# Patient Record
Sex: Male | Born: 1970 | Race: Black or African American | Hispanic: No | Marital: Single | Smoking: Former smoker
Health system: Southern US, Community
[De-identification: ages and names within clinical notes are randomized; demographics above are authoritative.]

## PROBLEM LIST (undated history)

## (undated) HISTORY — PX: EYE SURGERY: SHX253

---

## 2000-10-12 ENCOUNTER — Emergency Department (HOSPITAL_COMMUNITY): Admission: EM | Admit: 2000-10-12 | Discharge: 2000-10-12 | Payer: Self-pay

## 2002-07-30 ENCOUNTER — Emergency Department (HOSPITAL_COMMUNITY): Admission: EM | Admit: 2002-07-30 | Discharge: 2002-07-30 | Payer: Self-pay | Admitting: Emergency Medicine

## 2002-07-30 ENCOUNTER — Encounter: Payer: Self-pay | Admitting: Emergency Medicine

## 2011-07-07 ENCOUNTER — Other Ambulatory Visit (HOSPITAL_COMMUNITY): Payer: Self-pay | Admitting: Otolaryngology

## 2011-07-07 DIAGNOSIS — R221 Localized swelling, mass and lump, neck: Secondary | ICD-10-CM

## 2011-07-10 ENCOUNTER — Ambulatory Visit (HOSPITAL_COMMUNITY)
Admission: RE | Admit: 2011-07-10 | Discharge: 2011-07-10 | Disposition: A | Discharge status: Home / Self Care | Payer: Self-pay | Source: Ambulatory Visit | Attending: Otolaryngology | Admitting: Otolaryngology

## 2011-07-10 DIAGNOSIS — R221 Localized swelling, mass and lump, neck: Secondary | ICD-10-CM

## 2011-07-10 DIAGNOSIS — R220 Localized swelling, mass and lump, head: Secondary | ICD-10-CM | POA: Insufficient documentation

## 2011-07-14 ENCOUNTER — Other Ambulatory Visit: Payer: Self-pay | Admitting: Otolaryngology

## 2011-07-14 ENCOUNTER — Other Ambulatory Visit (HOSPITAL_COMMUNITY)
Admission: RE | Admit: 2011-07-14 | Discharge: 2011-07-14 | Disposition: A | Discharge status: Home / Self Care | Payer: Self-pay | Source: Ambulatory Visit | Attending: Otolaryngology | Admitting: Otolaryngology

## 2011-07-14 DIAGNOSIS — R599 Enlarged lymph nodes, unspecified: Secondary | ICD-10-CM | POA: Insufficient documentation

## 2011-07-28 ENCOUNTER — Other Ambulatory Visit (HOSPITAL_COMMUNITY): Payer: Self-pay

## 2011-08-02 ENCOUNTER — Other Ambulatory Visit: Payer: Self-pay | Admitting: Otolaryngology

## 2011-08-02 ENCOUNTER — Ambulatory Visit (HOSPITAL_COMMUNITY)
Admission: RE | Admit: 2011-08-02 | Discharge: 2011-08-02 | Disposition: A | Discharge status: Home / Self Care | Payer: Self-pay | Source: Ambulatory Visit | Attending: Otolaryngology | Admitting: Otolaryngology

## 2011-08-02 DIAGNOSIS — R599 Enlarged lymph nodes, unspecified: Secondary | ICD-10-CM | POA: Insufficient documentation

## 2011-08-02 DIAGNOSIS — L723 Sebaceous cyst: Secondary | ICD-10-CM | POA: Insufficient documentation

## 2011-08-02 LAB — CBC
HCT: 41.6 % (ref 39.0–52.0)
Hemoglobin: 14.4 g/dL (ref 13.0–17.0)
MCH: 29.4 pg (ref 26.0–34.0)
MCHC: 34.6 g/dL (ref 30.0–36.0)
MCV: 85.1 fL (ref 78.0–100.0)
Platelets: 177 10*3/uL (ref 150–400)
RBC: 4.89 MIL/uL (ref 4.22–5.81)
RDW: 14 % (ref 11.5–15.5)
WBC: 6.8 10*3/uL (ref 4.0–10.5)

## 2011-08-02 LAB — SURGICAL PCR SCREEN
MRSA, PCR: NEGATIVE
Staphylococcus aureus: NEGATIVE

## 2011-08-03 NOTE — Op Note (Signed)
NAME:  Dwayne Mendez, MANNING NO.:  [id number]  MEDICAL RECORD NO.:  [id number]  LOCATION:  [facility]                         FACILITY:  [facility]  PHYSICIAN:  Melvenia Beam, MD      DATE OF BIRTH:  01-Dec-1971  DATE OF PROCEDURE:  08/02/2011 DATE OF DISCHARGE:                              OPERATIVE REPORT   PREOPERATIVE DIAGNOSIS:  Level 1a neck mass/lymphadenopathy and right 1.5-cm facial cyst lateral to the right lateral canthus.  POSTOPERATIVE DIAGNOSIS:  Level 1a neck mass/lymphadenopathy and right 1.5-cm facial cyst lateral to the right lateral canthus.  PROCEDURES PERFORMED:  1. Open biopsy of the neck node and level 1a CPT 38510 2. excision with complex closure of right 1.5-cm facial lesion CPT 11442  SURGEON:  Melvenia Beam, MD PhD  ASSISTANT:  None.  FINDINGS:  A right facial cyst that was superficial to the orbicularis oculi, which grossly appeared to be a sebaceous cyst as well as a midline level Ia neck mass that appeared to be an enlarged lymph node along with several enlarged level 1a lymph nodes.  No injury to the temporal or zygomatic branch of facial nerve on the right and no injury to the marginal mandibular nerve bilaterally.  Intact facial nerve in all divisions bilaterally postoperatively.  COMPLICATIONS:  None.  ANESTHESIA:  General via endotracheal.  ESTIMATED BLOOD LOSS:  30 mL.  DRESSINGS:  Bacitracin.  DRAINS:  None.  SPECIMENS:  Level 1a neck dissection for frozen section and permanent pathology as well as culture and right 1.5 cm facial lesion for permanent pathology.  OPERATIVE DETAILS:  The patient was correctly identified in the preop holding area, brought back to the operating room from Anesthesia, placed supine on the operating table and intubated without incident.  The face was prepped and draped with sterile Betadine and sterile towels in a standard fashion.  The midline level 1a neck mass was palpated and a 3- cm incision in  horizontal fashion was drawn out over the mass.  This was injected with 1% lidocaine with epinephrine.  The right facial mass that was lateral to the right lateral canthus was also palpated.  A 3:1 length:width ratio ellipse was drawn out around this and measuring about 1.5 cm and this was also injected with 1% lidocaine with epinephrine.  First, we begin with the open biopsy of the deep cervical neck node and level Ia.  This was performed by making the incision with a #15 blade, carrying this through the subcutaneous and the subcutaneous fat layers and then switching to a Bovie electrocautery, dividing the platysma layer horizontally, and then dissecting down to the subplatysmal soft tissues.  This immediately demonstrated bulky level 1a lymphadenopathy with a dominant level 1a enlarged lymph node, measuring about 3 cm as well as several smaller surrounding level 1a lymph nodes.  These were carefully dissected out using the Bovie electrocautery, clearing out level 1a from the left mylohyoid muscle all the way over to the right mylohyoid muscle and dissecting these off of the strap muscles and clearing out the nodes and subplatysmal fat between the hyoglossus muscles bilaterally.  All of these nodes were removed en block and passed off and sent  for frozen section pathology, which came back as lymphadenopathy likely reactive, but pathology is going to defer to the permanent as well as culture, and flow cytometry to rule out lymphoma. While the frozen section pathology was coming back, we excised the right facial lesion.  Right facial lesion was excised by making an elliptical incision around the mass in the skin with a #15 blade, a combination of the tenotomies and #15 blade were then used to carefully dissect through the subcutaneous tissue and the subcutaneous fat coming around the base of the lesion.  Grossly, the lesion appeared to be a sebaceous cyst.  This was carefully dissected  out using the tenotomies until the lesion was excised entirely and taken off of the orbicularis oculi muscle.  We took care to stay superficial to the orbicularis oculi muscle to avoid any injury to the temporal or zygomatic branches of cranial nerve VII, which was noted to be intact and symmetric bilaterally postoperatively.  Once the mass was completely removed, the incision was closed in a standard fashion with deep 4-0 Vicryl sutures, which were buried.  Then, the skin was closed with interrupted 5-0 fast-absorbing gut sutures. The incision was then dressed with Bacitracin.  The midline submental incision was then closed in a similar fashion by reapproximating the platysma with horizontal mattress 3-0 Vicryl sutures and then closing the subcutaneous layer with 3-0 Vicryl deep buried sutures and then closing the skin with interrupted 5-0 fast-absorbing gut sutures.  This incision was then also dressed with bacitracin and the patient was returned under the care of anesthesia, awakened from anesthesia, and extubated without incident.  The patient tolerated the procedure well with no immediate complication.  Dr. Melvenia Beam was present and performed the entire procedure.          ______________________________ Melvenia Beam, MD     MG/MEDQ  D:  08/02/2011  T:  08/02/2011  Job:  [id number]  Electronically Signed by Melvenia Beam MD on 08/03/2011 12:02:55 PM

## 2011-08-05 LAB — TISSUE CULTURE: Culture: NO GROWTH

## 2011-09-14 LAB — AFB CULTURE WITH SMEAR (NOT AT ARMC)

## 2012-10-08 IMAGING — US US SOFT TISSUE HEAD/NECK
1 series · 14 of 15 positions shown · non-contrast
Comparison: None.

CLINICAL DATA: Submental mass.

ULTRASOUND OF HEAD/NECK SOFT TISSUES
TECHNIQUE: Ultrasound examination of the head and neck soft
tissues was performed in the area of clinical concern.

[Series 1: us soft tissue head/neck · 0.08mm/px · 14 of 15 slices shown]
[im 1/15]
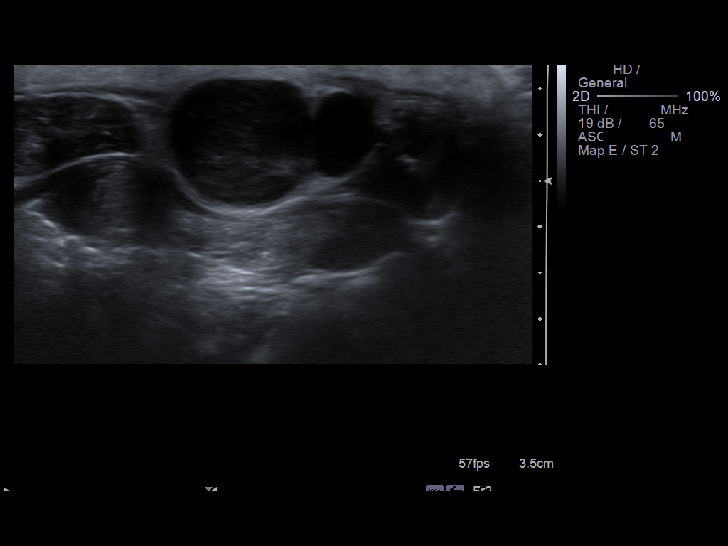
[im 2/15]
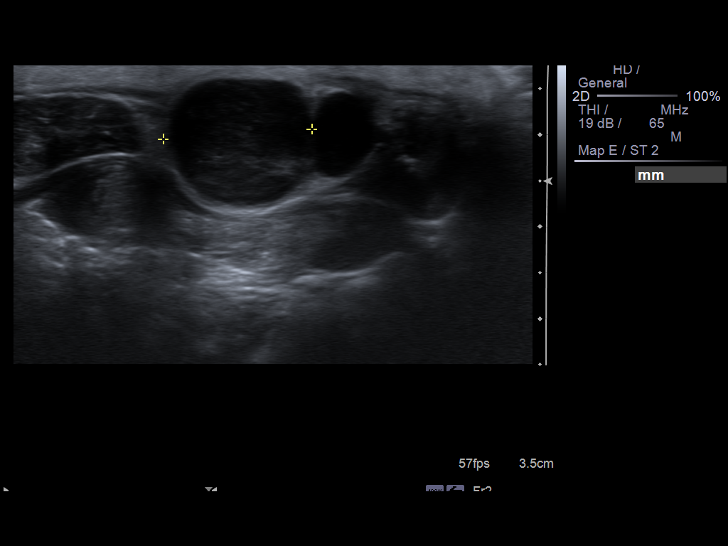
[im 3/15]
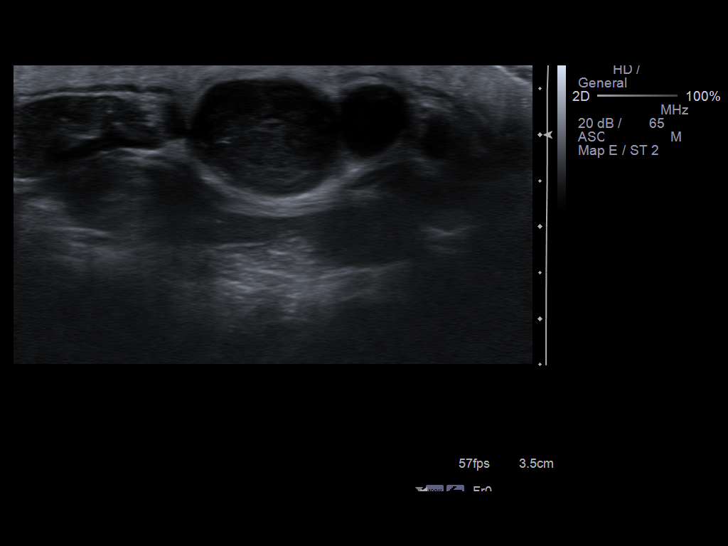
[im 4/15]
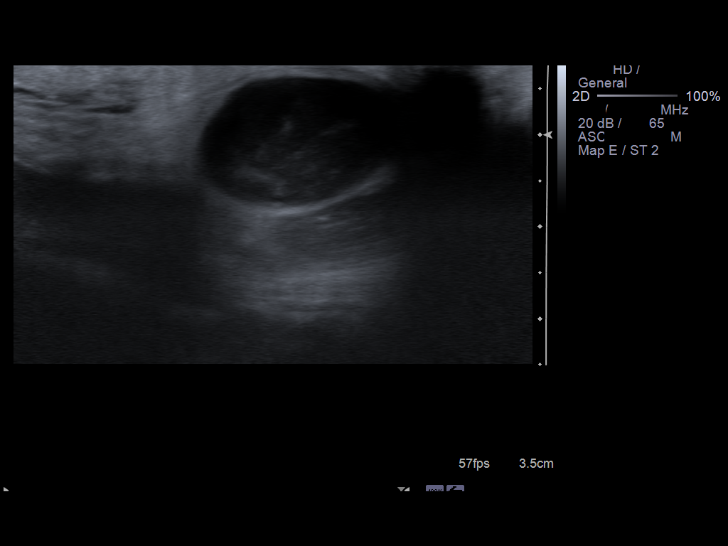
[im 5/15]
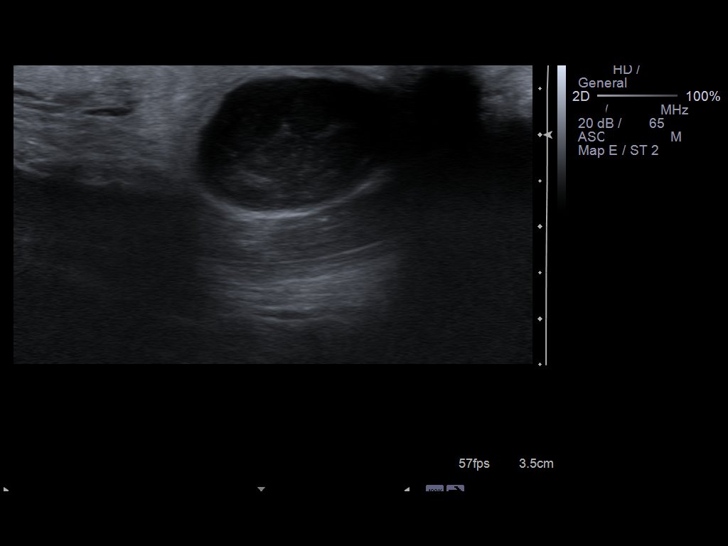
[im 6/15]
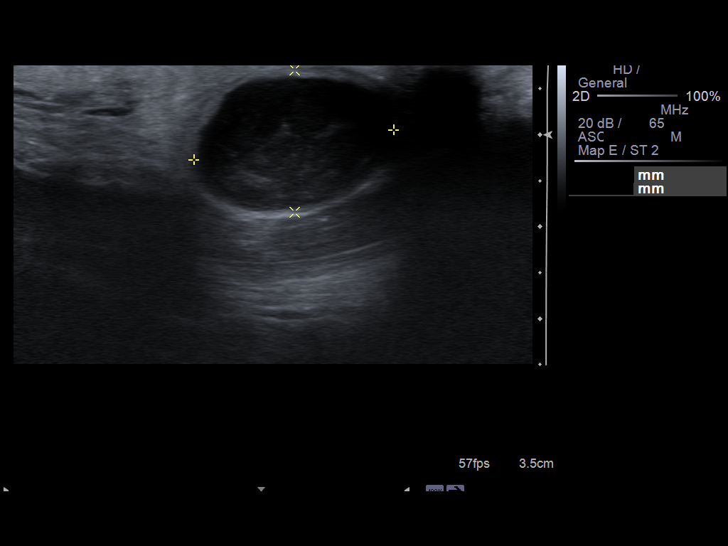
[im 7/15]
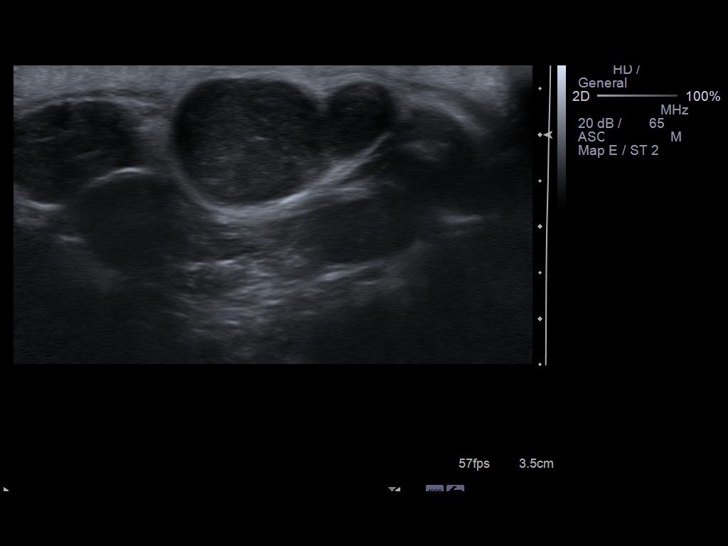
[im 9/15]
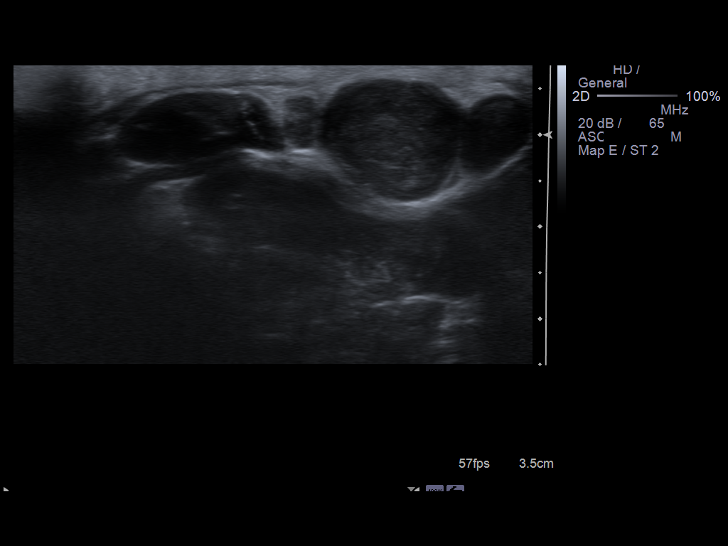
[im 10/15]
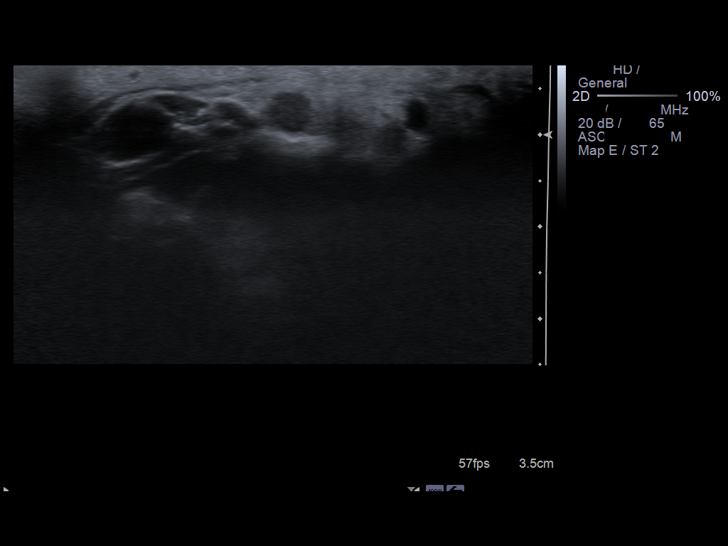
[im 11/15]
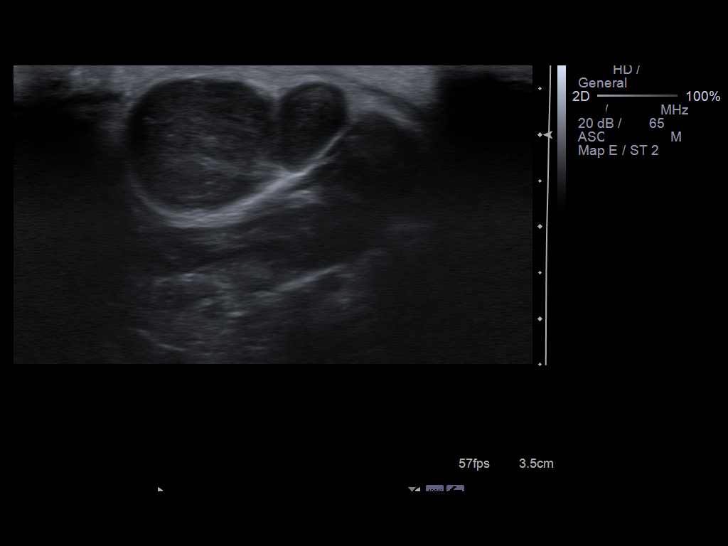
[im 12/15]
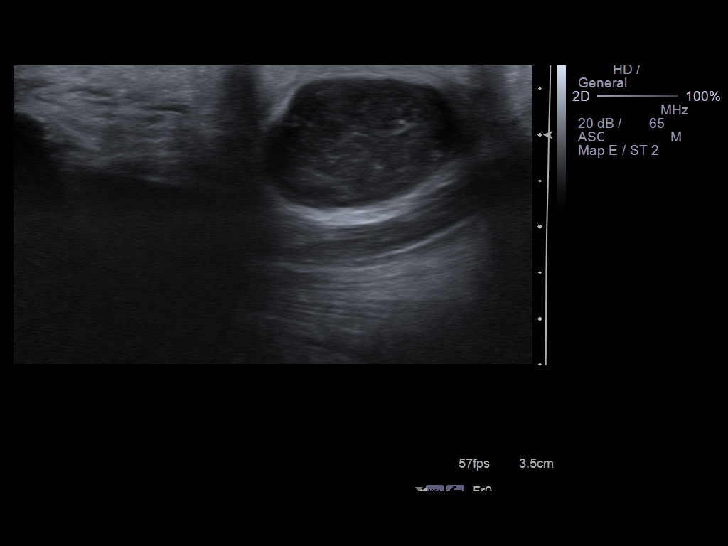
[im 13/15]
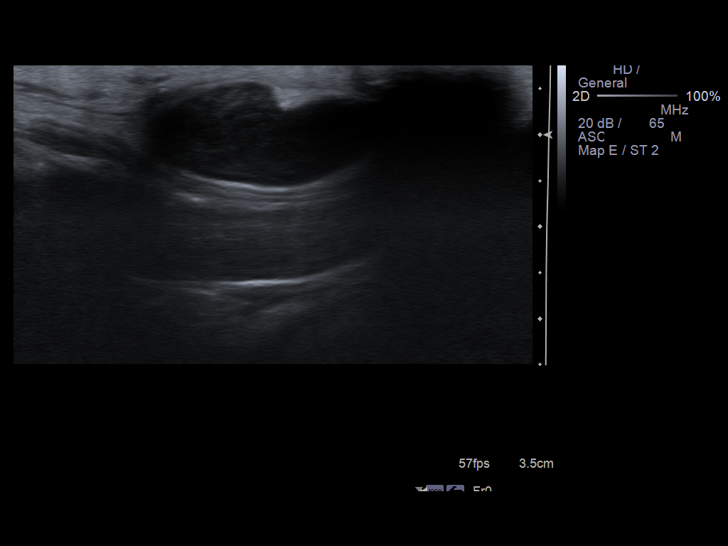
[im 14/15]
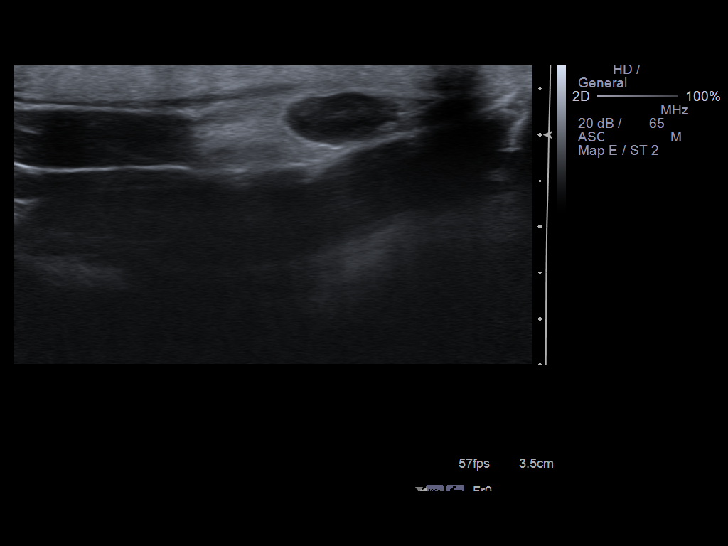
[im 15/15]
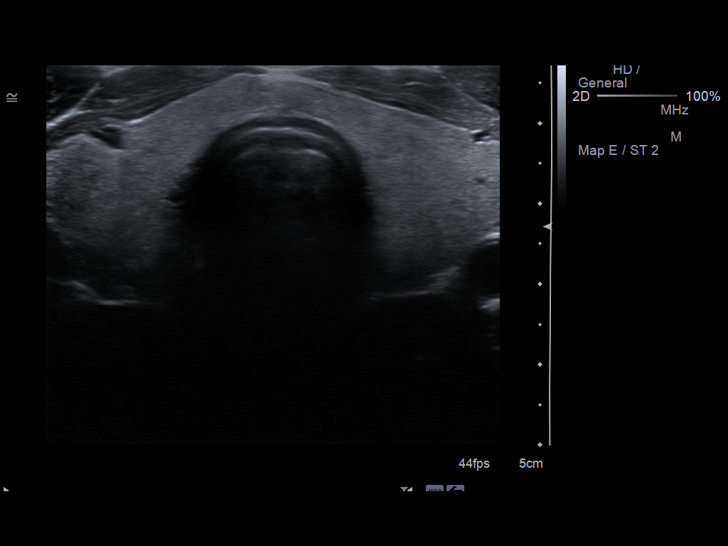

[14 of 15 positions shown; findings below may reference images not displayed]

FINDINGS: There is a 1.6 x 2.2 x 1.6 cm hypoechoic mass within the
midline submental space. There are at least three smaller
hypoechoic lesions adjacent to this dominant mass with similar
sonographic imaging features, and measure up to 0.8 x 1.6 cm.
These are favored to represent lymph nodes. The lesions are
separate from the thyroid gland.
IMPRESSION: 2.2 cm hypoechoic mass in the submental space is favored to
represent an abnormal lymph node.

There are several (at least three) adjacent prominent however
subcentimeter short axis lymph nodes with similar sonographic
features.

## 2017-05-16 ENCOUNTER — Encounter (HOSPITAL_COMMUNITY): Payer: Self-pay | Admitting: *Deleted

## 2017-05-16 ENCOUNTER — Emergency Department (HOSPITAL_COMMUNITY)
Admission: EM | Admit: 2017-05-16 | Discharge: 2017-05-16 | Disposition: A | Discharge status: Home / Self Care | Payer: Self-pay | Attending: Emergency Medicine | Admitting: Emergency Medicine

## 2017-05-16 ENCOUNTER — Emergency Department (HOSPITAL_COMMUNITY): Payer: Self-pay

## 2017-05-16 DIAGNOSIS — M5410 Radiculopathy, site unspecified: Secondary | ICD-10-CM | POA: Insufficient documentation

## 2017-05-16 MED ORDER — PREDNISONE 10 MG PO TABS: ORAL_TABLET | ORAL | 0 refills | Status: DC

## 2017-05-16 NOTE — Discharge Instructions (Signed)
Follow up with neurosurgery for further evaluation of pain. Take prednisone as prescribed to help with inflammation and pain. Return to ED if you have worsening symptoms including more pain, headaches, or increased back pain.

## 2017-05-16 NOTE — ED Notes (Signed)
PA at bedside.
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
## 2017-05-28 ENCOUNTER — Other Ambulatory Visit (HOSPITAL_COMMUNITY): Payer: Self-pay | Admitting: Family Medicine

## 2017-05-28 DIAGNOSIS — M542 Cervicalgia: Secondary | ICD-10-CM

## 2017-06-05 ENCOUNTER — Emergency Department (HOSPITAL_COMMUNITY): Admission: EM | Admit: 2017-06-05 | Discharge: 2017-06-05 | Discharge status: Absent without leave | Payer: Self-pay

## 2017-06-05 ENCOUNTER — Ambulatory Visit (HOSPITAL_COMMUNITY)
Admission: RE | Admit: 2017-06-05 | Discharge: 2017-06-05 | Disposition: A | Discharge status: Home / Self Care | Payer: Self-pay | Source: Ambulatory Visit | Attending: Family Medicine | Admitting: Family Medicine

## 2017-06-05 DIAGNOSIS — M47812 Spondylosis without myelopathy or radiculopathy, cervical region: Secondary | ICD-10-CM | POA: Insufficient documentation

## 2017-06-05 DIAGNOSIS — G8929 Other chronic pain: Secondary | ICD-10-CM | POA: Insufficient documentation

## 2017-06-05 DIAGNOSIS — M4802 Spinal stenosis, cervical region: Secondary | ICD-10-CM | POA: Insufficient documentation

## 2017-06-05 DIAGNOSIS — M542 Cervicalgia: Secondary | ICD-10-CM

## 2017-06-05 DIAGNOSIS — R220 Localized swelling, mass and lump, head: Secondary | ICD-10-CM | POA: Insufficient documentation

## 2017-06-05 DIAGNOSIS — M5134 Other intervertebral disc degeneration, thoracic region: Secondary | ICD-10-CM | POA: Insufficient documentation

## 2017-09-11 ENCOUNTER — Emergency Department (HOSPITAL_COMMUNITY)
Admission: EM | Admit: 2017-09-11 | Discharge: 2017-09-11 | Disposition: A | Discharge status: Home / Self Care | Payer: Self-pay | Attending: Emergency Medicine | Admitting: Emergency Medicine

## 2017-09-11 ENCOUNTER — Encounter (HOSPITAL_COMMUNITY): Payer: Self-pay | Admitting: Emergency Medicine

## 2017-09-11 DIAGNOSIS — Z87891 Personal history of nicotine dependence: Secondary | ICD-10-CM | POA: Insufficient documentation

## 2017-09-11 DIAGNOSIS — L02414 Cutaneous abscess of left upper limb: Secondary | ICD-10-CM | POA: Insufficient documentation

## 2017-09-11 MED ORDER — LIDOCAINE-EPINEPHRINE (PF) 2 %-1:200000 IJ SOLN
10.0000 mL | Freq: Once | INTRAMUSCULAR | Status: AC
  Administered 2017-09-11: 10 mL via INTRADERMAL
  Filled 2017-09-11: qty 20

## 2017-09-11 MED ORDER — DOXYCYCLINE HYCLATE 100 MG PO CAPS: 100.0000 mg | ORAL_CAPSULE | Freq: Two times a day (BID) | ORAL | 0 refills | Status: AC

## 2017-09-11 NOTE — ED Triage Notes (Signed)
Pt st's he was bitten by an insect 6 days ago on left forearm.  Now pt has an abscess to area

## 2017-09-11 NOTE — Discharge Instructions (Signed)
1. Medications: doxycycline 2. Treatment: keep wound clean and dry. Apply warm compresses to arm throughout the day. Take antibiotic to completion. Alternate 600 mg of ibuprofen and [phone] mg of Tylenol every 3 hours as needed for pain. Do not exceed 4000 mg of Tylenol daily. 3. Follow Up: Followup with [facility], urgent care, or PCP in 2 days for wound recheck.  Return to emergency department for emergent changing or worsening symptoms.

## 2017-09-11 NOTE — ED Triage Notes (Signed)
PT vagled during the I/D of abscess on Lt anterior forearm. Pt vomited x1 . Vitals signs WNL and Pt is A/O

## 2017-09-11 NOTE — ED Provider Notes (Signed)
[facility] Provider Note   CSN: [id number] Arrival date & time: 09/11/17  1629     History   Chief Complaint Chief Complaint  Patient presents with  . Insect Bite    HPI Dwayne Mendez is a 46 y.o. male who presents today with chief complaint acute onset, progressively worsening nodule to the volar aspect of the left forearm. He states that 6 days ago he believes he experienced an insect bite which resulted in erythema and a bump to the skin. He states that since then, the area has become progressively more swollen and erythematous. It is mildly tender to palpation. He also endorses swelling of the forearm, denies numbness, tingling, or weakness. He did apply a warm compress and was able to express clear fluid from the area earlier today. Denies fevers, chills, or other symptoms. 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  The history is provided by the patient.    History reviewed. No pertinent past medical history.  There are no active problems to display for this patient.   Past Surgical History:  Procedure Laterality Date  . EYE SURGERY         Home Medications    Prior to Admission medications   Medication Sig Start Date End Date Taking? Authorizing Provider  doxycycline (VIBRAMYCIN) 100 MG capsule Take 1 capsule (100 mg total) by mouth 2 (two) times daily. 09/11/17 09/21/17  Michela Pitcher A, PA-C  predniSONE (DELTASONE) 10 MG tablet Days 1-4 take 6 tablets a day Days 5-6 take 5 tablets a day Days 7-8 take 4 tablets a day Days 9-10 take 3 tablets a day Days 11-12 take 2 tablets a day 05/16/17   Caccavale, Sophia, PA-C    Family History No family history on file.  Social History Social History  Substance Use Topics  . Smoking status: Former Games developer  . Smokeless tobacco: Never Used  . Alcohol use No     Allergies   Penicillins   Review of Systems Review of Systems    Constitutional: Negative for chills and fever.  Skin: Positive for color change.  Neurological: Negative for weakness and numbness.     Physical Exam Updated Vital Signs BP (!) 100/52 (BP Location: Right Arm)   Pulse (!) 53   Temp 98 F (36.7 C) (Oral)   Resp 16   Ht  (1.778 m)   Wt 86.2 kg (190 lb)   SpO2 100%   BMI 27.26 kg/m   Physical Exam  Constitutional: He appears well-developed and well-nourished. No distress.  HENT:  Head: Normocephalic and atraumatic.  Eyes: Conjunctivae are normal. Right eye exhibits no discharge. Left eye exhibits no discharge.  Neck: Normal range of motion. Neck supple. No JVD present. No tracheal deviation present.  Cardiovascular: Normal rate and intact distal pulses.   2+ radial pulses bilaterally  Pulmonary/Chest: Effort normal.  Abdominal: He exhibits no distension.  Musculoskeletal: Normal range of motion. He exhibits no edema.  Neurological: He is alert.  Fluent speech, no facial droop, sensation intact to soft touch of extremities  Skin: Skin is warm and dry. There is erythema.  2cm x 2cm indurated and fluctuant area to the volar aspect of the left forearm. There is surrounding erythema of approximately 2 cm circumferentially. No bleeding. Mildly tender to palpation.  Psychiatric: He has a normal mood and affect. His behavior is normal.  Nursing note and vitals reviewed.  ED Treatments / Results  Labs (all labs ordered are listed, but only abnormal results are displayed) Labs Reviewed - No data to display  EKG  EKG Interpretation None       Radiology No results found.  Procedures .Marland KitchenIncision and Drainage Date/Time: 09/11/2017 7:03 PM Performed by: Michela Pitcher A Authorized by: Michela Pitcher A   Consent:    Consent obtained:  Verbal   Consent given by:  Patient   Risks discussed:  Bleeding, incomplete drainage and pain   Alternatives discussed:  No treatment Location:    Type:  Abscess   Size:  2cm    Location:  Upper extremity   Upper extremity location:  Arm   Arm location:  L lower arm Pre-procedure details:    Skin preparation:  Betadine Anesthesia (see MAR for exact dosages):    Anesthesia method:  Local infiltration   Local anesthetic:  Lidocaine 2% WITH epi Procedure type:    Complexity:  Simple Procedure details:    Needle aspiration: no     Incision types:  Stab incision   Incision depth:  Subcutaneous   Scalpel blade:  11   Wound management:  Probed and deloculated, irrigated with saline and extensive cleaning   Drainage:  Purulent   Drainage amount:  Moderate   Wound treatment:  Wound left open   Packing materials:  None Post-procedure details:    Patient tolerance of procedure:  Tolerated well, no immediate complications Comments:     Patient experienced a vasovagal episode directly after procedure for approximately 3 seconds. Vitals obtained, patient reassessed and he is back to baseline. No focal neurological deficits on examination. And pt is A &O 4.    (including critical care time)  Medications Ordered in ED Medications  lidocaine-EPINEPHrine (XYLOCAINE W/EPI) 2 %-1:200000 (PF) injection 10 mL (10 mLs Intradermal Given 09/11/17 1904)     Initial Impression / Assessment and Plan / ED Course  I have reviewed the triage vital signs and the nursing notes.  Pertinent labs & imaging results that were available during my care of the patient were reviewed by me and considered in my medical decision making (see chart for details).     Patient with skin abscess amenable to incision and drainage. Afebrile, VVS. Neurovascularly intact. Abscess was not large enough to warrant packing or drain,  wound recheck in 2 days. Encouraged home warm soaks and flushing. Moderate signs of cellulitis is surrounding skin. Given he [place] in [place] heavily [place] area and spends a lot of time outdoors, will d/c to home with course of doxycycline. Patient did have a brief vasovagal episode  after procedure, but returned to baseline shortly thereafter. He is resting comfortably at this time. Discussed indications for return to the ED. Pt verbalized understanding of and agreement with plan and is safe for discharge home at this time.  Final Clinical Impressions(s) / ED Diagnoses   Final diagnoses:  Abscess of left forearm    New Prescriptions Discharge Medication List as of 09/11/2017  7:07 PM    START taking these medications   Details  doxycycline (VIBRAMYCIN) 100 MG capsule Take 1 capsule (100 mg total) by mouth 2 (two) times daily., Starting Tue 09/11/2017, Until Fri 09/21/2017, Print         Ramona Slinger, [place] A, PA-C 09/12/17 0136    Mancel Bale, MD 09/12/17 1325

## 2017-09-11 NOTE — ED Notes (Signed)
Pt stable, ambulatory, states understanding of discharge instructions 

## 2020-03-22 NOTE — H&P (Signed)
HPI:   Chief Complaint  Patient presents with  . Adenopathy  with cyst since a year right side below jaw.   Dwayne Mendez is a 49 y.o. male who presents as new patient for right sided neck mass. Duration of one year. It has not fluctuated in size. It does not bother him and has never been infected. It does not fluctuate with meals. Location is under the right jaw. It does not drain. He did have a small cyst on the left inner thigh that he had popped and it resolved.   No fever, dysphagia, odynophagia, cough, hoarseness or dental problems.   No PMH of cardiopulmonary disease, diabetes, obstructive sleep apnea or bleeding disorder.   Non-smoker. He is a Producer, television/film/video.   PMH/Meds/All/SocHx/FamHx/ROS:   History reviewed. No pertinent past medical history.  History reviewed. No pertinent surgical history.  No family history of bleeding disorders, wound healing problems or difficulty with anesthesia.   Social History   Socioeconomic History  . Marital status: Separated Not Legal  Spouse name: Not on file  . Number of children: Not on file  . Years of education: Not on file  . Highest education level: Not on file  Occupational History  . Not on file  Tobacco Use  . Smoking status: Never Smoker  . Smokeless tobacco: Never Used  Substance and Sexual Activity  . Alcohol use: Not on file  . Drug use: Not on file  . Sexual activity: Not on file  Other Topics Concern  . Not on file  Social History Narrative  . Not on file   Social Determinants of Health   Financial Resource Strain:  . Difficulty of Paying Living Expenses:  Food Insecurity:  . Worried About Programme researcher, broadcasting/film/video in the Last Year:  . Barista in the Last Year:  Transportation Needs:  . Freight forwarder (Medical):  Marland Kitchen Lack of Transportation (Non-Medical):  Physical Activity:  . Days of Exercise per Week:  . Minutes of Exercise per Session:  Stress:  . Feeling of Stress :  Social Connections:  .  Frequency of Communication with Friends and Family:  . Frequency of Social Gatherings with Friends and Family:  . Attends Religious Services:  . Active Member of Clubs or Organizations:  . Attends Banker Meetings:  Marland Kitchen Marital Status:   No current outpatient medications on file.  A complete ROS was performed with pertinent positives/negatives noted in the HPI. The remainder of the ROS are negative.   Physical Exam:   BP 141/94  Pulse 81  Temp 97.4 F (36.3 C)  Ht 1.765 m (5' 9.5")  Wt 88.9 kg (196 lb)  BMI 28.53 kg/m   General Awake, at baseline alertness during examination.  Eyes No scleral icterus or conjunctival hemorrhage. Globe position appears normal. EOMI.  Right Ear EAC patent, TM intact w/o inflammation. Middle ear well aerated.  Left Ear EAC patent, TM intact w/o inflammation. Middle ear well aerated.  Nose Patent, no polyps or masses seen on anterior rhinoscopy.  Oral cavity Missing teeth. No mucosal lesions or tumors seen. Tongue midline.  Oropharynx Symmetric tonsils.  Neck 2cm well circumscribed superficial soft mass, non-tender, right level I. No abnormal cervical lymphadenopathy. No thyromegaly. No thyroid masses palpated.  Cardio-vascular No cyanosis.  Pulmonary No audible stridor. Breathing easily with no labor.  Neuro Symmetric facial movement.  Psychiatry Appropriate affect and mood for clinic visit.   Independent Review of Additional Tests or Records:  Medical records.   Procedures:  None  Impression & Plans:  Dwayne Mendez is a 49 y.o. male with 2cm right level I mass, likely an epithelial cyst or epidermoid cyst. Patient elects surgical removal. Risks, benefits and post-operative management discussed. Consent obtained. Patient will be scheduled accordingly at the outpatient surgical center.

## 2020-03-26 ENCOUNTER — Other Ambulatory Visit: Payer: Self-pay

## 2020-03-26 ENCOUNTER — Encounter (HOSPITAL_BASED_OUTPATIENT_CLINIC_OR_DEPARTMENT_OTHER): Payer: Self-pay | Admitting: Otolaryngology

## 2020-04-01 ENCOUNTER — Inpatient Hospital Stay (HOSPITAL_COMMUNITY): Admission: RE | Admit: 2020-04-01 | Payer: Self-pay | Source: Ambulatory Visit

## 2020-04-02 NOTE — Progress Notes (Addendum)
Spoke with patient, he states may not be able to get covid test today, instructed pt to call Dr. Lucky Rathke office and reschedule surgery, left message for [facility] at Dr. Lucky Rathke office.

## 2020-04-03 ENCOUNTER — Other Ambulatory Visit (HOSPITAL_COMMUNITY)
Admission: RE | Admit: 2020-04-03 | Discharge: 2020-04-03 | Disposition: A | Discharge status: Home / Self Care | Payer: HRSA Program | Source: Ambulatory Visit | Attending: Otolaryngology | Admitting: Otolaryngology

## 2020-04-03 DIAGNOSIS — Z20822 Contact with and (suspected) exposure to covid-19: Secondary | ICD-10-CM | POA: Insufficient documentation

## 2020-04-03 DIAGNOSIS — Z01812 Encounter for preprocedural laboratory examination: Secondary | ICD-10-CM | POA: Insufficient documentation

## 2020-04-03 LAB — SARS CORONAVIRUS 2 (TAT 6-24 HRS): SARS Coronavirus 2: NEGATIVE

## 2020-04-04 ENCOUNTER — Encounter (HOSPITAL_BASED_OUTPATIENT_CLINIC_OR_DEPARTMENT_OTHER): Payer: Self-pay | Admitting: Otolaryngology

## 2020-04-04 NOTE — Anesthesia Preprocedure Evaluation (Addendum)
Anesthesia Evaluation  Patient identified by MRN, date of birth, ID band Patient awake    Reviewed: Allergy & Precautions, NPO status , Patient's Chart, lab work & pertinent test results  Airway Mallampati: II  TM Distance: >3 FB Neck ROM: Full   Comment: 3cm cystic mass right submandibular space Dental no notable dental hx. (+) Teeth Intact   Pulmonary neg pulmonary ROS, former smoker,    Pulmonary exam normal breath sounds clear to auscultation       Cardiovascular negative cardio ROS Normal cardiovascular exam Rhythm:Regular Rate:Normal     Neuro/Psych negative neurological ROS  negative psych ROS   GI/Hepatic negative GI ROS, Neg liver ROS,   Endo/Other  negative endocrine ROS  Renal/GU negative Renal ROS  negative genitourinary   Musculoskeletal negative musculoskeletal ROS (+) Epidermoid cyst right neck submandibular   Abdominal   Peds  Hematology negative hematology ROS (+)   Anesthesia Other Findings   Reproductive/Obstetrics                            Anesthesia Physical Anesthesia Plan  ASA: I  Anesthesia Plan: General   Post-op Pain Management:    Induction: Intravenous  PONV Risk Score and Plan: 3 and Midazolam, Ondansetron, Dexamethasone and Treatment may vary due to age or medical condition  Airway Management Planned: Oral ETT and LMA  Additional Equipment:   Intra-op Plan:   Post-operative Plan: Extubation in OR  Informed Consent: I have reviewed the patients History and Physical, chart, labs and discussed the procedure including the risks, benefits and alternatives for the proposed anesthesia with the patient or authorized representative who has indicated his/her understanding and acceptance.     Dental advisory given  Plan Discussed with: CRNA and Surgeon  Anesthesia Plan Comments:        Anesthesia Quick Evaluation

## 2020-04-05 ENCOUNTER — Other Ambulatory Visit: Payer: Self-pay

## 2020-04-05 ENCOUNTER — Ambulatory Visit (HOSPITAL_BASED_OUTPATIENT_CLINIC_OR_DEPARTMENT_OTHER)
Admission: RE | Admit: 2020-04-05 | Discharge: 2020-04-05 | Disposition: A | Discharge status: Home / Self Care | Payer: Self-pay | Attending: Otolaryngology | Admitting: Otolaryngology

## 2020-04-05 ENCOUNTER — Encounter (HOSPITAL_BASED_OUTPATIENT_CLINIC_OR_DEPARTMENT_OTHER): Payer: Self-pay | Admitting: Otolaryngology

## 2020-04-05 ENCOUNTER — Ambulatory Visit (HOSPITAL_BASED_OUTPATIENT_CLINIC_OR_DEPARTMENT_OTHER): Payer: Self-pay | Admitting: Anesthesiology

## 2020-04-05 ENCOUNTER — Encounter (HOSPITAL_BASED_OUTPATIENT_CLINIC_OR_DEPARTMENT_OTHER)
Admission: RE | Disposition: A | Discharge status: Home / Self Care | Payer: Self-pay | Source: Home / Self Care | Attending: Otolaryngology

## 2020-04-05 DIAGNOSIS — L720 Epidermal cyst: Secondary | ICD-10-CM | POA: Insufficient documentation

## 2020-04-05 DIAGNOSIS — Z87891 Personal history of nicotine dependence: Secondary | ICD-10-CM | POA: Insufficient documentation

## 2020-04-05 HISTORY — PX: CYST EXCISION: SHX5701

## 2020-04-05 SURGERY — CYST REMOVAL
Anesthesia: General | Site: Neck | Laterality: Right

## 2020-04-05 MED ORDER — DEXAMETHASONE SODIUM PHOSPHATE 10 MG/ML IJ SOLN
INTRAMUSCULAR | Status: AC
  Filled 2020-04-05: qty 1

## 2020-04-05 MED ORDER — PROPOFOL 10 MG/ML IV BOLUS
INTRAVENOUS | Status: AC
  Filled 2020-04-05: qty 20

## 2020-04-05 MED ORDER — ONDANSETRON HCL 4 MG/2ML IJ SOLN: 4.0000 mg | Freq: Once | INTRAMUSCULAR | Status: DC | PRN

## 2020-04-05 MED ORDER — SUCCINYLCHOLINE CHLORIDE 200 MG/10ML IV SOSY
PREFILLED_SYRINGE | INTRAVENOUS | Status: AC
  Filled 2020-04-05: qty 10

## 2020-04-05 MED ORDER — HYDROCODONE-ACETAMINOPHEN 7.5-325 MG PO TABS: 1.0000 | ORAL_TABLET | Freq: Once | ORAL | Status: DC | PRN

## 2020-04-05 MED ORDER — PROPOFOL 10 MG/ML IV BOLUS
INTRAVENOUS | Status: DC | PRN
  Administered 2020-04-05: 200 mg via INTRAVENOUS

## 2020-04-05 MED ORDER — EPINEPHRINE PF 1 MG/ML IJ SOLN
INTRAMUSCULAR | Status: AC
  Filled 2020-04-05: qty 1

## 2020-04-05 MED ORDER — CLINDAMYCIN HCL 300 MG PO CAPS: 300.0000 mg | ORAL_CAPSULE | Freq: Three times a day (TID) | ORAL | 0 refills | Status: AC

## 2020-04-05 MED ORDER — GLYCOPYRROLATE PF 0.2 MG/ML IJ SOSY
PREFILLED_SYRINGE | INTRAMUSCULAR | Status: AC
  Filled 2020-04-05: qty 1

## 2020-04-05 MED ORDER — EPHEDRINE 5 MG/ML INJ
INTRAVENOUS | Status: AC
  Filled 2020-04-05: qty 10

## 2020-04-05 MED ORDER — MIDAZOLAM HCL 2 MG/2ML IJ SOLN
INTRAMUSCULAR | Status: AC
  Filled 2020-04-05: qty 2

## 2020-04-05 MED ORDER — MEPERIDINE HCL 25 MG/ML IJ SOLN: 6.2500 mg | INTRAMUSCULAR | Status: DC | PRN

## 2020-04-05 MED ORDER — PHENYLEPHRINE 40 MCG/ML (10ML) SYRINGE FOR IV PUSH (FOR BLOOD PRESSURE SUPPORT)
PREFILLED_SYRINGE | INTRAVENOUS | Status: AC
  Filled 2020-04-05: qty 10

## 2020-04-05 MED ORDER — LIDOCAINE 2% (20 MG/ML) 5 ML SYRINGE
INTRAMUSCULAR | Status: AC
  Filled 2020-04-05: qty 5

## 2020-04-05 MED ORDER — MIDAZOLAM HCL 2 MG/2ML IJ SOLN: 1.0000 mg | INTRAMUSCULAR | Status: DC | PRN

## 2020-04-05 MED ORDER — CIPROFLOXACIN-DEXAMETHASONE 0.3-0.1 % OT SUSP
OTIC | Status: AC
  Filled 2020-04-05: qty 7.5

## 2020-04-05 MED ORDER — BACITRACIN ZINC 500 UNIT/GM EX OINT
TOPICAL_OINTMENT | CUTANEOUS | Status: AC
  Filled 2020-04-05: qty 28.35

## 2020-04-05 MED ORDER — GLYCOPYRROLATE 0.2 MG/ML IJ SOLN
INTRAMUSCULAR | Status: DC | PRN
Start: 2020-04-05 — End: 2020-04-05
  Administered 2020-04-05: .2 mg via INTRAVENOUS

## 2020-04-05 MED ORDER — FENTANYL CITRATE (PF) 100 MCG/2ML IJ SOLN
INTRAMUSCULAR | Status: AC
  Filled 2020-04-05: qty 2

## 2020-04-05 MED ORDER — FENTANYL CITRATE (PF) 100 MCG/2ML IJ SOLN
INTRAMUSCULAR | Status: DC | PRN
  Administered 2020-04-05: 50 ug via INTRAVENOUS

## 2020-04-05 MED ORDER — EPHEDRINE SULFATE 50 MG/ML IJ SOLN
INTRAMUSCULAR | Status: DC | PRN
  Administered 2020-04-05 (×2): 10 mg via INTRAVENOUS

## 2020-04-05 MED ORDER — METHYLENE BLUE 0.5 % INJ SOLN
INTRAVENOUS | Status: AC
  Filled 2020-04-05: qty 10

## 2020-04-05 MED ORDER — MIDAZOLAM HCL 5 MG/5ML IJ SOLN
INTRAMUSCULAR | Status: DC | PRN
  Administered 2020-04-05: 2 mg via INTRAVENOUS

## 2020-04-05 MED ORDER — LIDOCAINE-EPINEPHRINE 1 %-1:100000 IJ SOLN
INTRAMUSCULAR | Status: DC | PRN
  Administered 2020-04-05: 1 mL

## 2020-04-05 MED ORDER — LACTATED RINGERS IV SOLN: INTRAVENOUS | Status: DC

## 2020-04-05 MED ORDER — FENTANYL CITRATE (PF) 100 MCG/2ML IJ SOLN: 25.0000 ug | INTRAMUSCULAR | Status: DC | PRN

## 2020-04-05 MED ORDER — ONDANSETRON HCL 4 MG/2ML IJ SOLN
INTRAMUSCULAR | Status: AC
  Filled 2020-04-05: qty 2

## 2020-04-05 MED ORDER — LIDOCAINE-EPINEPHRINE 1 %-1:100000 IJ SOLN
INTRAMUSCULAR | Status: AC
  Filled 2020-04-05: qty 2

## 2020-04-05 MED ORDER — LACTATED RINGERS IV SOLN
INTRAVENOUS | Status: DC | PRN
Start: 2020-04-05 — End: 2020-04-05

## 2020-04-05 MED ORDER — LIDOCAINE HCL (CARDIAC) PF 100 MG/5ML IV SOSY
PREFILLED_SYRINGE | INTRAVENOUS | Status: DC | PRN
  Administered 2020-04-05: 50 mg via INTRAVENOUS

## 2020-04-05 MED ORDER — DEXAMETHASONE SODIUM PHOSPHATE 4 MG/ML IJ SOLN
INTRAMUSCULAR | Status: DC | PRN
  Administered 2020-04-05: 10 mg via INTRAVENOUS

## 2020-04-05 MED ORDER — FENTANYL CITRATE (PF) 100 MCG/2ML IJ SOLN: 50.0000 ug | INTRAMUSCULAR | Status: DC | PRN

## 2020-04-05 SURGICAL SUPPLY — 58 items
ATTRACTOMAT 16X20 MAGNETIC DRP (DRAPES) IMPLANT
BAND RUBBER #18 3X1/16 STRL (MISCELLANEOUS) IMPLANT
BENZOIN TINCTURE PRP APPL 2/3 (GAUZE/BANDAGES/DRESSINGS) IMPLANT
BLADE SURG 15 STRL LF DISP TIS (BLADE) ×1 IMPLANT
BLADE SURG 15 STRL SS (BLADE) ×2
CANISTER SUCT 1200ML W/VALVE (MISCELLANEOUS) ×3 IMPLANT
CLEANER CAUTERY TIP 5X5 PAD (MISCELLANEOUS) ×1 IMPLANT
CLOSURE WOUND 1/2 X4 (GAUZE/BANDAGES/DRESSINGS)
CORD BIPOLAR FORCEPS 12FT (ELECTRODE) IMPLANT
COVER BACK TABLE 60X90IN (DRAPES) ×3 IMPLANT
COVER MAYO STAND STRL (DRAPES) ×3 IMPLANT
COVER WAND RF STERILE (DRAPES) IMPLANT
DERMABOND ADVANCED (GAUZE/BANDAGES/DRESSINGS) ×2
DERMABOND ADVANCED .7 DNX12 (GAUZE/BANDAGES/DRESSINGS) ×1 IMPLANT
DRAIN JACKSON RD 7FR 3/32 (WOUND CARE) IMPLANT
DRAIN PENROSE 1/4X12 LTX STRL (WOUND CARE) IMPLANT
DRAPE U-SHAPE 76X120 STRL (DRAPES) ×3 IMPLANT
ELECT COATED BLADE 2.86 ST (ELECTRODE) ×3 IMPLANT
ELECT REM PT RETURN 9FT ADLT (ELECTROSURGICAL) ×3
ELECTRODE REM PT RTRN 9FT ADLT (ELECTROSURGICAL) ×1 IMPLANT
GAUZE 4X4 16PLY RFD (DISPOSABLE) IMPLANT
GAUZE SPONGE 4X4 12PLY STRL LF (GAUZE/BANDAGES/DRESSINGS) IMPLANT
GLOVE BIO SURGEON STRL SZ 6.5 (GLOVE) ×2 IMPLANT
GLOVE BIO SURGEONS STRL SZ 6.5 (GLOVE) ×1
GLOVE BIOGEL PI IND STRL 7.0 (GLOVE) ×1 IMPLANT
GLOVE BIOGEL PI INDICATOR 7.0 (GLOVE) ×2
GLOVE ECLIPSE 7.5 STRL STRAW (GLOVE) ×3 IMPLANT
GOWN STRL REUS W/ TWL LRG LVL3 (GOWN DISPOSABLE) ×2 IMPLANT
GOWN STRL REUS W/TWL LRG LVL3 (GOWN DISPOSABLE) ×4
NEEDLE HYPO 25X1 1.5 SAFETY (NEEDLE) IMPLANT
NEEDLE PRECISIONGLIDE 27X1.5 (NEEDLE) ×3 IMPLANT
NS IRRIG 1000ML POUR BTL (IV SOLUTION) ×3 IMPLANT
PAD CLEANER CAUTERY TIP 5X5 (MISCELLANEOUS) ×2
PENCIL FOOT CONTROL (ELECTRODE) ×3 IMPLANT
SET BASIN DAY SURGERY F.S. (CUSTOM PROCEDURE TRAY) ×3 IMPLANT
SHEET MEDIUM DRAPE 40X70 STRL (DRAPES) IMPLANT
SLEEVE SCD COMPRESS KNEE MED (MISCELLANEOUS) IMPLANT
SPONGE GAUZE 2X2 8PLY STER LF (GAUZE/BANDAGES/DRESSINGS)
SPONGE GAUZE 2X2 8PLY STRL LF (GAUZE/BANDAGES/DRESSINGS) IMPLANT
STAPLER VISISTAT 35W (STAPLE) IMPLANT
STRIP CLOSURE SKIN 1/2X4 (GAUZE/BANDAGES/DRESSINGS) IMPLANT
SUCTION FRAZIER HANDLE 10FR (MISCELLANEOUS) ×2
SUCTION TUBE FRAZIER 10FR DISP (MISCELLANEOUS) ×1 IMPLANT
SUT CHROMIC 3 0 PS 2 (SUTURE) ×3 IMPLANT
SUT CHROMIC 4 0 P 3 18 (SUTURE) IMPLANT
SUT ETHILON 4 0 PS 2 18 (SUTURE) IMPLANT
SUT ETHILON 5 0 P 3 18 (SUTURE)
SUT ETHILON 6 0 P 1 (SUTURE) IMPLANT
SUT NYLON ETHILON 5-0 P-3 1X18 (SUTURE) IMPLANT
SUT PLAIN 5 0 P 3 18 (SUTURE) IMPLANT
SUT SILK 4 0 TIES 17X18 (SUTURE) IMPLANT
SUT VICRYL 4-0 PS2 18IN ABS (SUTURE) IMPLANT
SYR BULB EAR ULCER 3OZ GRN STR (SYRINGE) ×3 IMPLANT
SYR CONTROL 10ML LL (SYRINGE) ×3 IMPLANT
TOWEL GREEN STERILE FF (TOWEL DISPOSABLE) ×3 IMPLANT
TRAY DSU PREP LF (CUSTOM PROCEDURE TRAY) ×3 IMPLANT
TUBE CONNECTING 20'X1/4 (TUBING) ×1
TUBE CONNECTING 20X1/4 (TUBING) ×2 IMPLANT

## 2020-04-05 NOTE — Transfer of Care (Signed)
Immediate Anesthesia Transfer of Care Note  Patient: Dwayne Mendez  Procedure(s) Performed: EXCISION RIGHT NECK CYST (Right Neck)  Patient Location: PACU  Anesthesia Type:General  Level of Consciousness: sedated  Airway & Oxygen Therapy: Patient Spontanous Breathing and Patient connected to face mask oxygen  Post-op Assessment: Report given to RN and Post -op Vital signs reviewed and stable  Post vital signs: Reviewed and stable  Last Vitals:  Vitals Value Taken Time  BP 129/86 04/05/20 0825  Temp    Pulse 81 04/05/20 0826  Resp 13 04/05/20 0826  SpO2 100 % 04/05/20 0826  Vitals shown include unvalidated device data.  Last Pain:  Vitals:   04/05/20 0725  TempSrc: Oral      Patients Stated Pain Goal: 3 (04/05/20 0725)  Complications: No apparent anesthesia complications

## 2020-04-05 NOTE — Discharge Instructions (Signed)
You may shower and use soap and water. Do not use any creams, oils or ointment.   Post Anesthesia Home Care Instructions  Activity: Get plenty of rest for the remainder of the day. A responsible individual must stay with you for 24 hours following the procedure.  For the next 24 hours, DO NOT: -Drive a car -Advertising copywriter -Drink alcoholic beverages -Take any medication unless instructed by your physician -Make any legal decisions or sign important papers.  Meals: Start with liquid foods such as gelatin or soup. Progress to regular foods as tolerated. Avoid greasy, spicy, heavy foods. If nausea and/or vomiting occur, drink only clear liquids until the nausea and/or vomiting subsides. Call your physician if vomiting continues.  Special Instructions/Symptoms: Your throat may feel dry or sore from the anesthesia or the breathing tube placed in your throat during surgery. If this causes discomfort, gargle with warm salt water. The discomfort should disappear within 24 hours.  Call your surgeon if you experience:   1.  Fever over 101.0. 2.  Inability to urinate. 3.  Nausea and/or vomiting. 4.  Extreme swelling or bruising at the surgical site. 5.  Continued bleeding from the incision. 6.  Increased pain, redness or drainage from the incision. 7.  Problems related to your pain medication. 8.  Any problems and/or concerns

## 2020-04-05 NOTE — Anesthesia Procedure Notes (Signed)
Procedure Name: LMA Insertion Date/Time: 04/05/2020 7:42 AM Performed by: Pe Ell Desanctis, CRNA Pre-anesthesia Checklist: Patient identified, Emergency Drugs available, Suction available, Patient being monitored and Timeout performed Patient Re-evaluated:Patient Re-evaluated prior to induction Oxygen Delivery Method: Circle system utilized Preoxygenation: Pre-oxygenation with 100% oxygen Induction Type: IV induction Ventilation: Mask ventilation without difficulty LMA: LMA inserted LMA Size: 5.0 Number of attempts: 1 Airway Equipment and Method: Bite block Placement Confirmation: positive ETCO2 Tube secured with: Tape Dental Injury: Teeth and Oropharynx as per pre-operative assessment

## 2020-04-05 NOTE — Op Note (Signed)
OPERATIVE REPORT  DATE OF SURGERY: 04/05/2020  PATIENT:  Dwayne Mendez,  49 y.o. male  PRE-OPERATIVE DIAGNOSIS:  right epidermoid cyst of neck  POST-OPERATIVE DIAGNOSIS:  right epidermoid cyst of neck  PROCEDURE:  Procedure(s): EXCISION RIGHT NECK CYST  SURGEON:  Susy Frizzle, MD  ASSISTANTS: None  ANESTHESIA:   General   EBL: 10 ml  DRAINS: None  LOCAL MEDICATIONS USED: 1% Xylocaine with epinephrine  SPECIMEN: Right neck cyst  COUNTS:  Correct  PROCEDURE DETAILS: The patient was taken to the operating room and placed on the operating table in the supine position. Following induction of general endotracheal anesthesia, using laryngeal mask airway, the right neck was prepped and draped in standard fashion and the beard was shaved overlying the mass.  The mass was approximately 5 cm in diameter and just at the inferior border of the mandible.  An ellipse of skin was outlined with a marking pen approximately 6 cm in length.  Local anesthetic was infiltrated.  #15 scalpel was used to incise the skin.  The cyst wall was identified and careful dissection around the entire cyst was accomplished.  The cyst was superficial to the platysma muscle.  The entire cyst was removed.  During the dissection it did rupture and white cheesy material was obtained.  The cyst wall was kept intact otherwise and the entire cyst was removed.  This was sent for pathologic evaluation.  The wound was irrigated with saline and hemostasis was completed using electrocautery.  The defect was closed in layers using interrupted 3-0 chromic subcuticular and Dermabond on the skin.  Patient was awakened extubated transferred to recovery in stable condition.    PATIENT DISPOSITION:  To PACU, stable

## 2020-04-05 NOTE — Interval H&P Note (Signed)
History and Physical Interval Note:  04/05/2020 7:15 AM  Dwayne Mendez  has presented today for surgery, with the diagnosis of right epidermoid cyst of neck.  The various methods of treatment have been discussed with the patient and family. After consideration of risks, benefits and other options for treatment, the patient has consented to  Procedure(s): CYST REMOVAL OF NECK (Right) as a surgical intervention.  The patient's history has been reviewed, patient examined, no change in status, stable for surgery.  I have reviewed the patient's chart and labs.  Questions were answered to the patient's satisfaction.     Serena Colonel

## 2020-04-05 NOTE — Anesthesia Postprocedure Evaluation (Signed)
Anesthesia Post Note  Patient: Cole Eastridge  Procedure(s) Performed: EXCISION RIGHT NECK CYST (Right Neck)     Patient location during evaluation: PACU Anesthesia Type: General Level of consciousness: awake and alert and oriented Pain management: pain level controlled Vital Signs Assessment: post-procedure vital signs reviewed and stable Respiratory status: spontaneous breathing, nonlabored ventilation and respiratory function stable Cardiovascular status: blood pressure returned to baseline and stable Postop Assessment: no apparent nausea or vomiting Anesthetic complications: no    Last Vitals:  Vitals:   04/05/20 0845 04/05/20 0850  BP: (!) 143/88   Pulse: 87 85  Resp: 15 16  Temp:    SpO2: 100% 100%    Last Pain:  Vitals:   04/05/20 0845  TempSrc:   PainSc: 0-No pain                 Jordy Verba A.

## 2020-04-06 ENCOUNTER — Encounter: Payer: Self-pay | Admitting: *Deleted

## 2020-04-06 LAB — SURGICAL PATHOLOGY
